# Patient Record
Sex: Male | Born: 1995 | Race: White | Hispanic: No | Marital: Single | Smoking: Light tobacco smoker
Health system: Southern US, Community
[De-identification: ages and names within clinical notes are randomized; demographics above are authoritative.]

---
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
## 2013-12-23 ENCOUNTER — Emergency Department (HOSPITAL_COMMUNITY)
Admission: EM | Admit: 2013-12-23 | Discharge: 2013-12-23 | Disposition: A | Discharge status: Home / Self Care | Payer: BC Managed Care – PPO | Attending: Emergency Medicine | Admitting: Emergency Medicine

## 2013-12-23 ENCOUNTER — Encounter (HOSPITAL_COMMUNITY): Payer: Self-pay | Admitting: Emergency Medicine

## 2013-12-23 DIAGNOSIS — F17200 Nicotine dependence, unspecified, uncomplicated: Secondary | ICD-10-CM | POA: Insufficient documentation

## 2013-12-23 DIAGNOSIS — F10929 Alcohol use, unspecified with intoxication, unspecified: Secondary | ICD-10-CM

## 2013-12-23 DIAGNOSIS — E162 Hypoglycemia, unspecified: Secondary | ICD-10-CM | POA: Insufficient documentation

## 2013-12-23 DIAGNOSIS — R42 Dizziness and giddiness: Secondary | ICD-10-CM | POA: Insufficient documentation

## 2013-12-23 DIAGNOSIS — F1010 Alcohol abuse, uncomplicated: Secondary | ICD-10-CM | POA: Insufficient documentation

## 2013-12-23 DIAGNOSIS — F10129 Alcohol abuse with intoxication, unspecified: Secondary | ICD-10-CM

## 2013-12-23 DIAGNOSIS — R55 Syncope and collapse: Secondary | ICD-10-CM | POA: Insufficient documentation

## 2013-12-23 LAB — CBG MONITORING, ED: GLUCOSE-CAPILLARY: 97 mg/dL (ref 70–99)

## 2013-12-23 MED ORDER — ONDANSETRON 4 MG PO TBDP
4.0000 mg | ORAL_TABLET | Freq: Once | ORAL | Status: AC
  Administered 2013-12-23: 4 mg via ORAL
  Filled 2013-12-23: qty 1

## 2013-12-23 NOTE — ED Notes (Signed)
Per EMS, found down on [facility] campus, denies ETOH/Drugs-states he went to party last night-woke up this am and startted walking back to house when he started feeling weird-CBG 54, gave instaglucose gel 24grams, CBG 270-EMS states patient is answering questions appropriately although doesn't seem straight forward, hiding something-patient c/o being dizzy and legs feel weird

## 2013-12-23 NOTE — ED Notes (Signed)
Patient admits to ETOH last pm. Pt states he consumed about 7 alcoholic beverages last pm. Pt complains of nausea at this time. Pt given ODT Zofran. Will continue to monitor.

## 2013-12-23 NOTE — ED Notes (Signed)
Pt given water and saltines to see if he can tolerate. Pt is A&O and in NAD

## 2013-12-23 NOTE — ED Notes (Signed)
Pt given OJ. Pt is A&O and in NAD

## 2013-12-23 NOTE — Discharge Instructions (Signed)
Alcohol Intoxication  Alcohol intoxication occurs when you drink enough alcohol that it affects your ability to function. It can be mild or very severe. Drinking a lot of alcohol in a short time is called binge drinking. This can be very harmful. Drinking alcohol can also be more dangerous if you are taking medicines or other drugs. Some of the effects caused by alcohol may include:  · Loss of coordination.  · Changes in mood and behavior.  · Unclear thinking.  · Trouble talking (slurred speech).  · Throwing up (vomiting).  · Confusion.  · Slowed breathing.  · Twitching and shaking (seizures).  · Loss of consciousness.  HOME CARE  · Do not drive after drinking alcohol.  · Drink enough water and fluids to keep your pee (urine) clear or pale yellow. Avoid caffeine.  · Only take medicine as told by your doctor.  GET HELP IF:  · You throw up (vomit) many times.  · You do not feel better after a few days.  · You frequently have alcohol intoxication. Your doctor can help decide if you should see a substance use treatment counselor.  GET HELP RIGHT AWAY IF:  · You become shaky when you stop drinking.  · You have twitching and shaking.  · You throw up blood. It may look bright red or like coffee grounds.  · You notice blood in your poop (bowel movements).  · You become lightheaded or pass out (faint).  MAKE SURE YOU:   · Understand these instructions.  · Will watch your condition.  · Will get help right away if you are not doing well or get worse.  Document Released: 02/17/2008 Document Revised: 05/03/2013 Document Reviewed: 02/03/2013  Exit[employer]® Patient Information ©2014 [employer].

## 2013-12-23 NOTE — ED Provider Notes (Signed)
CSN: [id number]     Arrival date & time 12/23/13  1058 History   First MD Initiated Contact with Patient 12/23/13 1059     Chief Complaint  Patient presents with  . Hypoglycemia     (Consider location/radiation/quality/duration/timing/severity/associated sxs/prior Treatment) Patient is a 18 y.o. male presenting with hypoglycemia. The history is provided by the patient.  Hypoglycemia Initial blood sugar:  50s Blood sugar after intervention:  270s Severity:  Moderate Onset quality:  Sudden Timing:  Constant Progression:  Resolved Chronicity:  New Diabetic status:  Non-diabetic Context comment:  Alcohol use last night Relieved by:  Oral glucose Associated symptoms: dizziness and syncope   Associated symptoms: no seizures, no shortness of breath, no sweats, no tremors, no visual change and no vomiting     History reviewed. No pertinent past medical history. History reviewed. No pertinent past surgical history. No family history on file. History  Substance Use Topics  . Smoking status: Light Tobacco Smoker  . Smokeless tobacco: Not on file  . Alcohol Use: Yes    Review of Systems  Constitutional: Negative for fever and diaphoresis.  Respiratory: Negative for cough and shortness of breath.   Cardiovascular: Positive for syncope. Negative for chest pain.  Gastrointestinal: Negative for vomiting.  Neurological: Positive for dizziness. Negative for tremors and seizures.  All other systems reviewed and are negative.     Allergies  Review of patient's allergies indicates not on file.  Home Medications  No current outpatient prescriptions on file. BP 111/61  Pulse 57  Temp(Src) 97.9 F (36.6 C) (Oral)  Resp 18  SpO2 100% Physical Exam  Nursing note and vitals reviewed. Constitutional: He is oriented to person, place, and time. He appears well-developed and well-nourished. No distress.  HENT:  Head: Normocephalic and atraumatic.  Mouth/Throat: No oropharyngeal  exudate.  Eyes: EOM are normal. Pupils are equal, round, and reactive to light.  Neck: Normal range of motion. Neck supple.  Cardiovascular: Normal rate and regular rhythm.  Exam reveals no friction rub.   No murmur heard. Pulmonary/Chest: Effort normal and breath sounds normal. No respiratory distress. He has no wheezes. He has no rales.  Abdominal: Soft. He exhibits no distension. There is no tenderness. There is no rebound.  Musculoskeletal: Normal range of motion. He exhibits no edema.  Neurological: He is alert and oriented to person, place, and time. No cranial nerve deficit. He exhibits normal muscle tone. Coordination normal.  Skin: No rash noted. He is not diaphoretic.    ED Course  Procedures (including critical care time) Labs Review Labs Reviewed  CBG MONITORING, ED   Imaging Review No results found.   EKG Interpretation   Date/Time:  Saturday December 23 2013 11:24:54 EDT Ventricular Rate:  60 PR Interval:  172 QRS Duration: 90 QT Interval:  417 QTC Calculation: 417 R Axis:   89 Text Interpretation:  Sinus arrhythmia ST elev, probable normal early  repol pattern No prior, no reciprocal changes Confirmed by Gwendolyn GrantWALDEN  MD,  Aleayah Chico ([id number]) on 12/23/2013 2:35:11 PM      MDM   Final diagnoses:  Alcohol intoxication  Hangover  Hypoglycemia  Dizziness    18 year old male found lying in the grass with [facility] legs in [facility]. Went to a party and divided alcohol or site. It is not hot or drug use. Was unable to walk him due to dizziness of the leg down. Hyperglycemic with EMS in the 50s, given glucose which he stated was "delicious and made him  feel better." Still feeling lightheaded here. Hx of syncope due to dehydration, no family hx of early cardiac death, no hx of cardiac problems. Not on medication.  Coordinated on exam, normal strength and sensation. Vitals stable. Will give water, zofran, food, obtain EKG, and observe. Symptoms all likely due to alcohol. Sugar  improved, EKG normal. Ambulating well, stable for discharge.  Dagmar Hait, MD 12/23/13 1435

## 2013-12-23 NOTE — ED Notes (Signed)
Pt calling for ride home 

## 2013-12-23 NOTE — ED Notes (Signed)
Bed: [id number] Expected date: 12/23/13 Expected time: 10:45 AM Means of arrival: Ambulance Comments: Low Blood Sugar, no history

## 2013-12-23 NOTE — ED Notes (Signed)
Nurse Tech at bedside to complete EKG.

## 2013-12-23 NOTE — ED Notes (Signed)
Patient cbg 101- Stacy RN notified

## 2013-12-25 LAB — CBG MONITORING, ED: Glucose-Capillary: 101 mg/dL — ABNORMAL HIGH (ref 70–99)

## 2019-11-06 ENCOUNTER — Institutional Professional Consult (permissible substitution): Payer: BC Managed Care – PPO | Admitting: Critical Care Medicine

## 2019-11-06 NOTE — Progress Notes (Deleted)
Synopsis: Referred in February 2021 for partners of breath by No ref. provider found.  Subjective:   PATIENT ID: Jacob Stevenson GENDER: male DOB: May 24, 1996, MRN: [id number]  No chief complaint on file.   HPI   Flowsheet data: abdominal CT scan @[facility] @ No results found for: NITRICOXIDE  No past medical history on file.   No family history on file.   No past surgical history on file.  Social History   Socioeconomic History  . Marital status: Single    Spouse name: Not on file  . Number of children: Not on file  . Years of education: Not on file  . Highest education level: Not on file  Occupational History  . Not on file  Tobacco Use  . Smoking status: Light Tobacco Smoker  Substance and Sexual Activity  . Alcohol use: Yes  . Drug use: Yes    Types: Marijuana  . Sexual activity: Not on file  Other Topics Concern  . Not on file  Social History Narrative  . Not on file   Social Determinants of Health   Financial Resource Strain:   . Difficulty of Paying Living Expenses: Not on file  Food Insecurity:   . Worried About Charity fundraiser in the Last Year: Not on file  . Ran Out of Food in the Last Year: Not on file  Transportation Needs:   . Lack of Transportation (Medical): Not on file  . Lack of Transportation (Non-Medical): Not on file  Physical Activity:   . Days of Exercise per Week: Not on file  . Minutes of Exercise per Session: Not on file  Stress:   . Feeling of Stress : Not on file  Social Connections:   . Frequency of Communication with Friends and Family: Not on file  . Frequency of Social Gatherings with Friends and Family: Not on file  . Attends Religious Services: Not on file  . Active Member of Clubs or Organizations: Not on file  . Attends Archivist Meetings: Not on file  . Marital Status: Not on file  Intimate Partner Violence:   . Fear of Current or Ex-Partner: Not on file  . Emotionally Abused: Not on file  . Physically  Abused: Not on file  . Sexually Abused: Not on file     No Known Allergies    There is no immunization history on file for this patient.  No outpatient medications prior to visit.   No facility-administered medications prior to visit.    ROS   Objective:  There were no vitals filed for this visit.   on *** LPM *** RA BMI Readings from Last 3 Encounters:  No data found for BMI   Wt Readings from Last 3 Encounters:  No data found for Wt    Physical Exam   CBC No results found for: WBC, RBC, HGB, HCT, PLT, MCV, MCH, MCHC, RDW, LYMPHSABS, MONOABS, EOSABS, BASOSABS  CHEMISTRY No results for input(s): NA, K, CL, CO2, GLUCOSE, BUN, CREATININE, CALCIUM, MG, PHOS in the last 168 hours. CrCl cannot be calculated (No successful lab value found.). ***  Chest Imaging- films reviewed: ***  Pulmonary Functions Testing Results: No flowsheet data found.  Pathology: ***  Echocardiogram: ***  Heart Catheterization: ***    Assessment & Plan:   No diagnosis found.   No current outpatient medications on file.   I spent *** minutes on this encounter, including face to face time and non-face to face time spent reviewing  records, charting, coordinating care, etc.   Steffanie Dunn, DO [facility] 11/06/2019 8:58 AM
